# Patient Record
Sex: Female | Born: 1995 | Race: Black or African American | Hispanic: No | Marital: Single | Smoking: Current some day smoker
Health system: Southern US, Community
[De-identification: ages and names within clinical notes are randomized; demographics above are authoritative.]

---

## 2014-12-26 ENCOUNTER — Emergency Department (HOSPITAL_BASED_OUTPATIENT_CLINIC_OR_DEPARTMENT_OTHER): Payer: No Typology Code available for payment source

## 2014-12-26 ENCOUNTER — Emergency Department (HOSPITAL_BASED_OUTPATIENT_CLINIC_OR_DEPARTMENT_OTHER)
Admission: EM | Admit: 2014-12-26 | Discharge: 2014-12-26 | Disposition: A | Discharge status: Home / Self Care | Payer: No Typology Code available for payment source | Attending: Emergency Medicine | Admitting: Emergency Medicine

## 2014-12-26 ENCOUNTER — Encounter (HOSPITAL_BASED_OUTPATIENT_CLINIC_OR_DEPARTMENT_OTHER): Payer: Self-pay

## 2014-12-26 DIAGNOSIS — S3992XA Unspecified injury of lower back, initial encounter: Secondary | ICD-10-CM | POA: Diagnosis present

## 2014-12-26 DIAGNOSIS — Z3202 Encounter for pregnancy test, result negative: Secondary | ICD-10-CM | POA: Diagnosis not present

## 2014-12-26 DIAGNOSIS — S39012A Strain of muscle, fascia and tendon of lower back, initial encounter: Secondary | ICD-10-CM

## 2014-12-26 DIAGNOSIS — Y998 Other external cause status: Secondary | ICD-10-CM | POA: Diagnosis not present

## 2014-12-26 DIAGNOSIS — Y9389 Activity, other specified: Secondary | ICD-10-CM | POA: Diagnosis not present

## 2014-12-26 DIAGNOSIS — S79912A Unspecified injury of left hip, initial encounter: Secondary | ICD-10-CM | POA: Diagnosis not present

## 2014-12-26 DIAGNOSIS — Y92410 Unspecified street and highway as the place of occurrence of the external cause: Secondary | ICD-10-CM | POA: Insufficient documentation

## 2014-12-26 LAB — PREGNANCY, URINE: PREG TEST UR: NEGATIVE

## 2014-12-26 MED ORDER — IBUPROFEN 800 MG PO TABS
800.0000 mg | ORAL_TABLET | Freq: Once | ORAL | Status: AC
  Administered 2014-12-26: 800 mg via ORAL
  Filled 2014-12-26: qty 1

## 2014-12-26 MED ORDER — CYCLOBENZAPRINE HCL 10 MG PO TABS
10.0000 mg | ORAL_TABLET | Freq: Once | ORAL | Status: AC
  Administered 2014-12-26: 10 mg via ORAL
  Filled 2014-12-26: qty 1

## 2014-12-26 MED ORDER — NAPROXEN 500 MG PO TABS: 500.0000 mg | ORAL_TABLET | Freq: Two times a day (BID) | ORAL | Status: DC

## 2014-12-26 MED ORDER — METHOCARBAMOL 500 MG PO TABS: 500.0000 mg | ORAL_TABLET | Freq: Two times a day (BID) | ORAL | Status: AC
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
## 2014-12-26 NOTE — Discharge Instructions (Signed)

## 2015-02-02 ENCOUNTER — Emergency Department (HOSPITAL_BASED_OUTPATIENT_CLINIC_OR_DEPARTMENT_OTHER)
Admission: EM | Admit: 2015-02-02 | Discharge: 2015-02-02 | Disposition: A | Discharge status: Home / Self Care | Payer: Self-pay | Attending: Emergency Medicine | Admitting: Emergency Medicine

## 2015-02-02 ENCOUNTER — Emergency Department (HOSPITAL_BASED_OUTPATIENT_CLINIC_OR_DEPARTMENT_OTHER): Payer: Self-pay

## 2015-02-02 ENCOUNTER — Encounter (HOSPITAL_BASED_OUTPATIENT_CLINIC_OR_DEPARTMENT_OTHER): Payer: Self-pay | Admitting: Emergency Medicine

## 2015-02-02 DIAGNOSIS — S93401A Sprain of unspecified ligament of right ankle, initial encounter: Secondary | ICD-10-CM

## 2015-02-02 DIAGNOSIS — W108XXA Fall (on) (from) other stairs and steps, initial encounter: Secondary | ICD-10-CM | POA: Insufficient documentation

## 2015-02-02 DIAGNOSIS — Z79899 Other long term (current) drug therapy: Secondary | ICD-10-CM | POA: Insufficient documentation

## 2015-02-02 DIAGNOSIS — S93402A Sprain of unspecified ligament of left ankle, initial encounter: Secondary | ICD-10-CM | POA: Insufficient documentation

## 2015-02-02 DIAGNOSIS — Y9289 Other specified places as the place of occurrence of the external cause: Secondary | ICD-10-CM | POA: Insufficient documentation

## 2015-02-02 DIAGNOSIS — Y9389 Activity, other specified: Secondary | ICD-10-CM | POA: Insufficient documentation

## 2015-02-02 DIAGNOSIS — F17210 Nicotine dependence, cigarettes, uncomplicated: Secondary | ICD-10-CM | POA: Insufficient documentation

## 2015-02-02 DIAGNOSIS — Y990 Civilian activity done for income or pay: Secondary | ICD-10-CM | POA: Insufficient documentation

## 2015-02-02 MED ORDER — IBUPROFEN 800 MG PO TABS
800.0000 mg | ORAL_TABLET | Freq: Once | ORAL | Status: AC
  Administered 2015-02-02: 800 mg via ORAL
  Filled 2015-02-02: qty 1

## 2015-02-02 MED ORDER — NAPROXEN 500 MG PO TABS: 500.0000 mg | ORAL_TABLET | Freq: Two times a day (BID) | ORAL | Status: AC

## 2015-02-02 NOTE — ED Provider Notes (Signed)
CSN: [id number]     Arrival date & time 02/02/15  1314 History  By signing my name below, I, Nicole Hill, attest that this documentation has been prepared under the direction and in the presence of Jerelyn Scott, MD. Electronically Signed: Phillis Hill, ED Scribe. 02/02/2015. 3:13 PM.   Chief Complaint  Patient presents with  . Ankle Injury   Patient is a 20 y.o. female presenting with lower extremity injury. The history is provided by the patient. No language interpreter was used.  Ankle Injury This is a new problem. The current episode started yesterday. The problem occurs constantly. The problem has been gradually worsening. She has tried acetaminophen for the symptoms. The treatment provided no relief.  HPI Comments: Nicole Hill is a 20 y.o. female who presents to the Emergency Department complaining of a left ankle injury onset one day ago. Pt states that she slipped down stairs last night at work, falling forward onto her ankle. She states that she is unable to bear weight on the foot and presents in the ED sitting in a wheelchair. She reports taking 400 mg ibuprofen at 1030 AM to no relief. She denies hitting head, LOC, numbness or weakness to the area.   History reviewed. No pertinent past medical history. History reviewed. No pertinent past surgical history. No family history on file. Social History  Substance Use Topics  . Smoking status: Current Some Day Smoker    Types: Cigarettes  . Smokeless tobacco: None  . Alcohol Use: No   OB History    No data available     Review of Systems  Musculoskeletal: Positive for arthralgias.  Neurological: Negative for syncope, weakness and numbness.  All other systems reviewed and are negative.  Allergies  Review of patient's allergies indicates no known allergies.  Home Medications   Prior to Admission medications   Medication Sig Start Date End Date Taking? Authorizing Provider  methocarbamol (ROBAXIN) 500 MG tablet Take  1 tablet (500 mg total) by mouth 2 (two) times daily. 12/26/14   Rolland Porter, MD  naproxen (NAPROSYN) 500 MG tablet Take 1 tablet (500 mg total) by mouth 2 (two) times daily. 02/02/15   Jerelyn Scott, MD   BP 117/59 mmHg  Pulse 85  Temp(Src) 98.5 F (36.9 C) (Oral)  Resp 18  Ht  (1.6 m)  Wt 215 lb (97.523 kg)  BMI 38.09 kg/m2  SpO2 100%  LMP 01/13/2015  Vitals reviewed Physical Exam  Physical Examination: General appearance - alert, well appearing, and in no distress Mental status - alert, oriented to person, place, and time Chest - clear to auscultation, no wheezes, rales or rhonchi, symmetric air entry Neurological - alert, oriented, normal speech, strength and sensation intact in left foot /toes Musculoskeletal - ttp over lateral and medial malleolus, no ttp over proximal fibula, no joint tenderness, deformity or swelling Extremities - peripheral pulses normal, no pedal edema, no clubbing or cyanosis Skin - normal coloration and turgor, no rashes  ED Course  Procedures (including critical care time) DIAGNOSTIC STUDIES: Oxygen Saturation is 99% on RA, normal by my interpretation.    COORDINATION OF CARE: 3:11 PM-Discussed treatment plan which includes x-ray, ankle brace, crutches and orthopedics follow up with pt at bedside and pt agreed to plan.    Labs Review Labs Reviewed - No data to display  Imaging Review Dg Ankle Complete Left  02/02/2015  CLINICAL DATA:  Fall downstairs, left ankle pain and swelling. Decreased range of motion. EXAM: LEFT ANKLE COMPLETE -  3+ VIEW COMPARISON:  None. FINDINGS: There is no evidence of fracture, dislocation, or joint effusion. There is no evidence of arthropathy or other focal bone abnormality. Soft tissues are unremarkable. IMPRESSION: Negative. Electronically Signed   By: Judie Petit.  Shick M.D.   On: 02/02/2015 13:57   I have personally reviewed and evaluated these images and lab results as part of my medical decision-making.   EKG  Interpretation None      MDM   Final diagnoses:  Ankle sprain, right, initial encounter    Pt presenting with c/o right ankle pain after fall.  Xray is reassuring.  Foot/toes and NVI.  Pt placed in ASO, given crutches.  Advised RICE, antiinflammatories.  Discharged with strict return precautions.  Pt agreeable with plan.  I personally performed the services described in this documentation, which was scribed in my presence. The recorded information has been reviewed and is accurate.     Jerelyn Scott, MD 02/02/15 [phone]

## 2015-02-02 NOTE — Discharge Instructions (Signed)
Return to the ED with any concerns including increased pain, swelling/numbness/discoloration of foot or toes, or any other alarming symptoms °

## 2015-02-02 NOTE — ED Notes (Signed)
Pt tripped down stairs last night and injured left ankle.  Pt took ibuprofen  at 1030 this morning with no relief.

## 2016-10-14 IMAGING — DX DG ANKLE COMPLETE 3+V*L*
3 series · 3 of 3 positions shown · non-contrast
Comparison: None.

CLINICAL DATA: Fall downstairs, left ankle pain and swelling.
Decreased range of motion.

EXAM:
LEFT ANKLE COMPLETE - 3+ VIEW

[ankle ap]
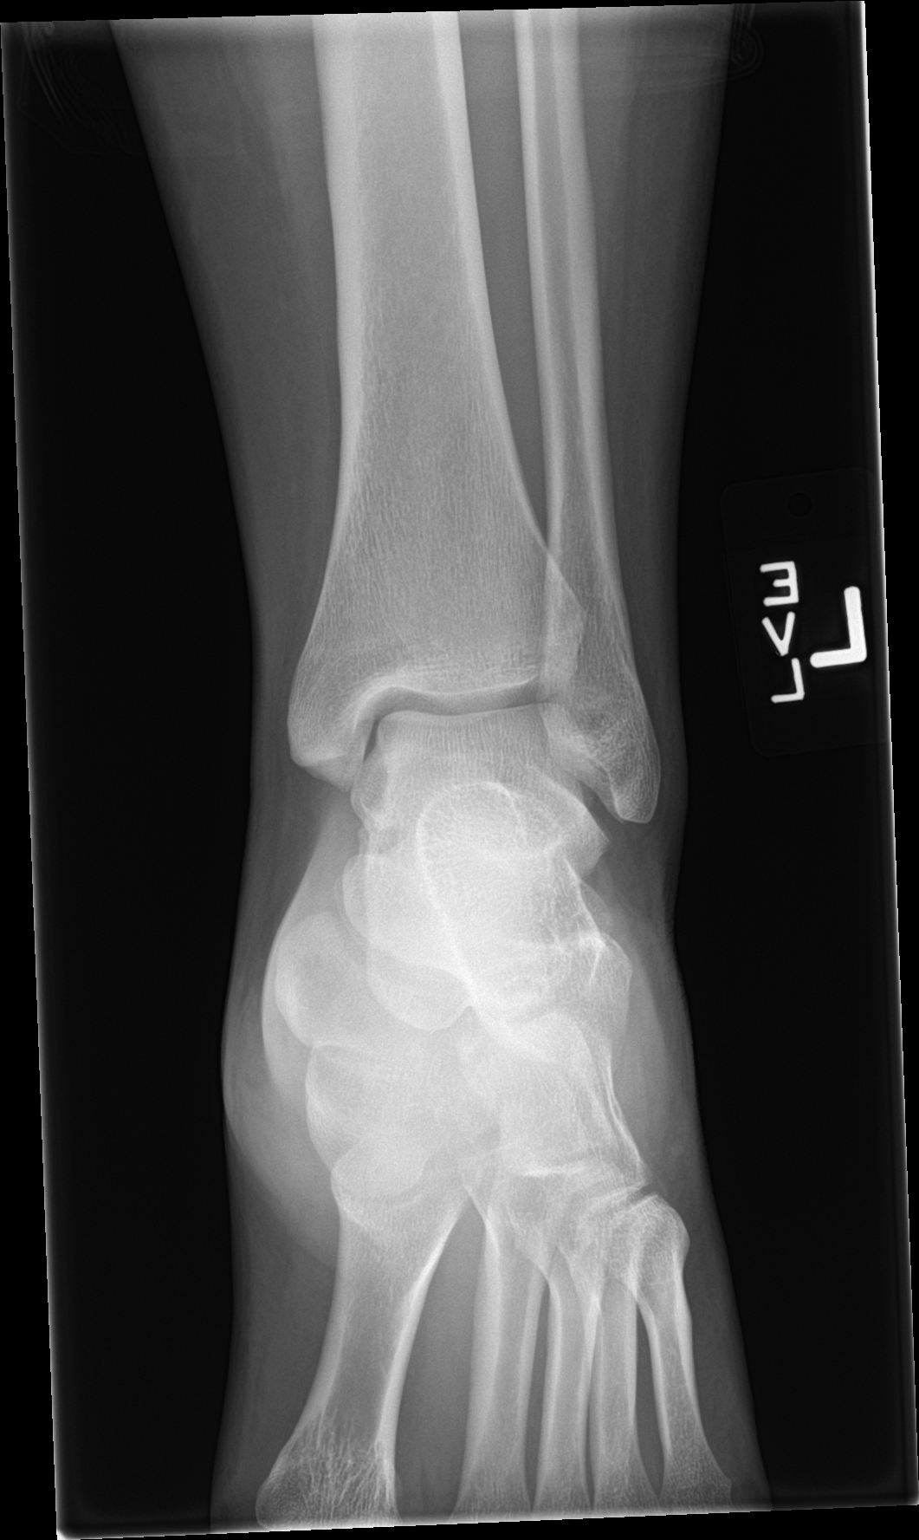

[ankle obl]
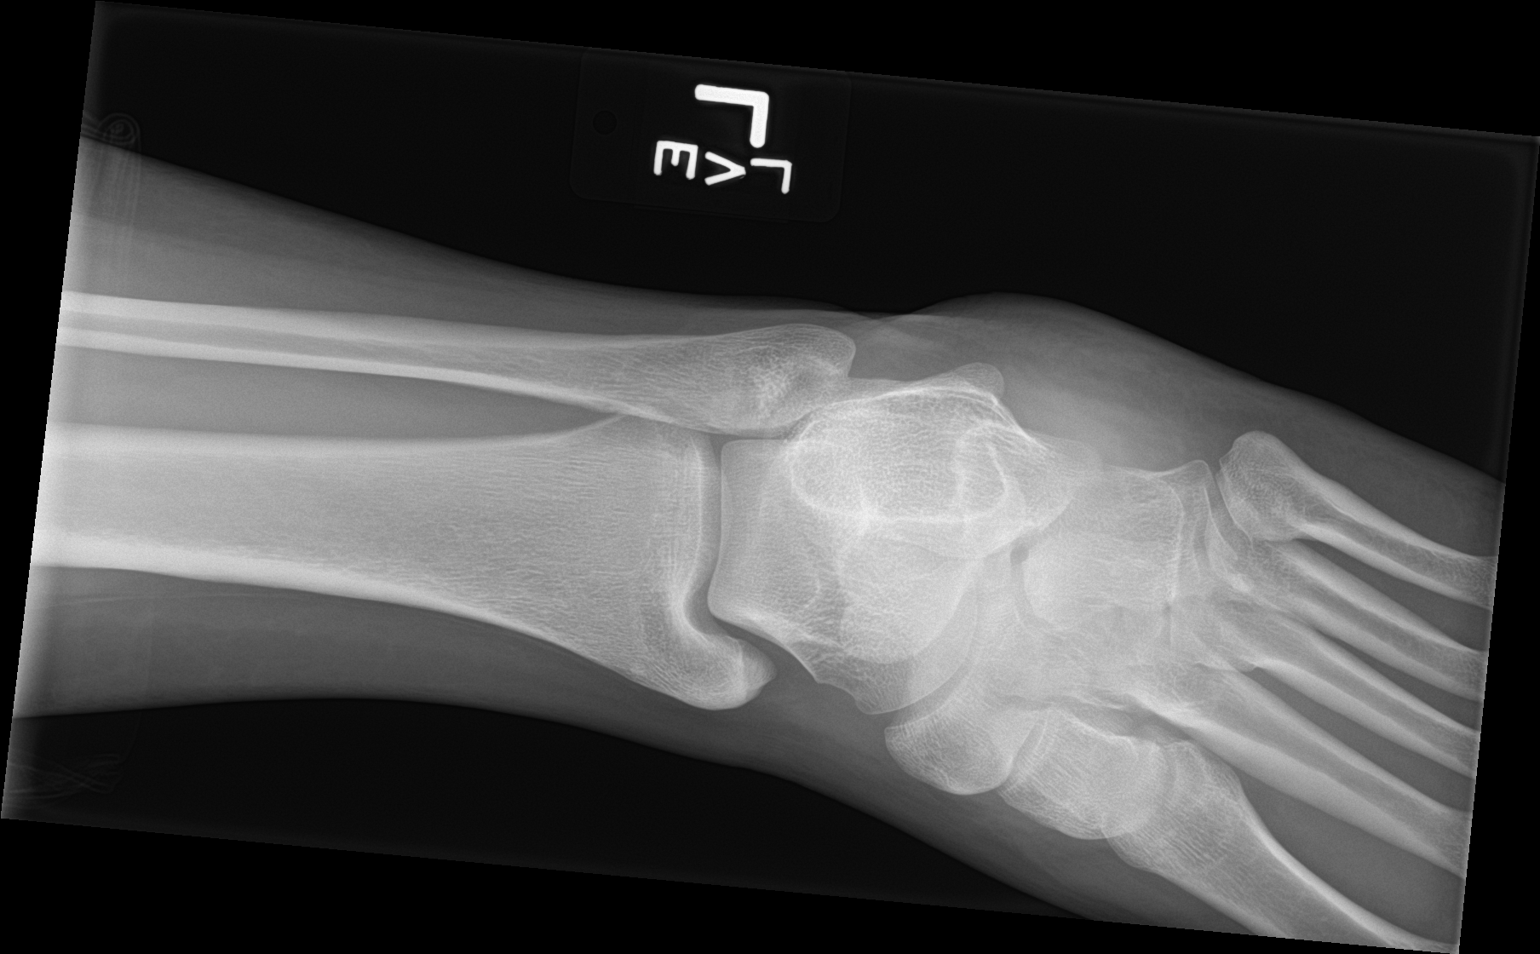

[ankle lat]
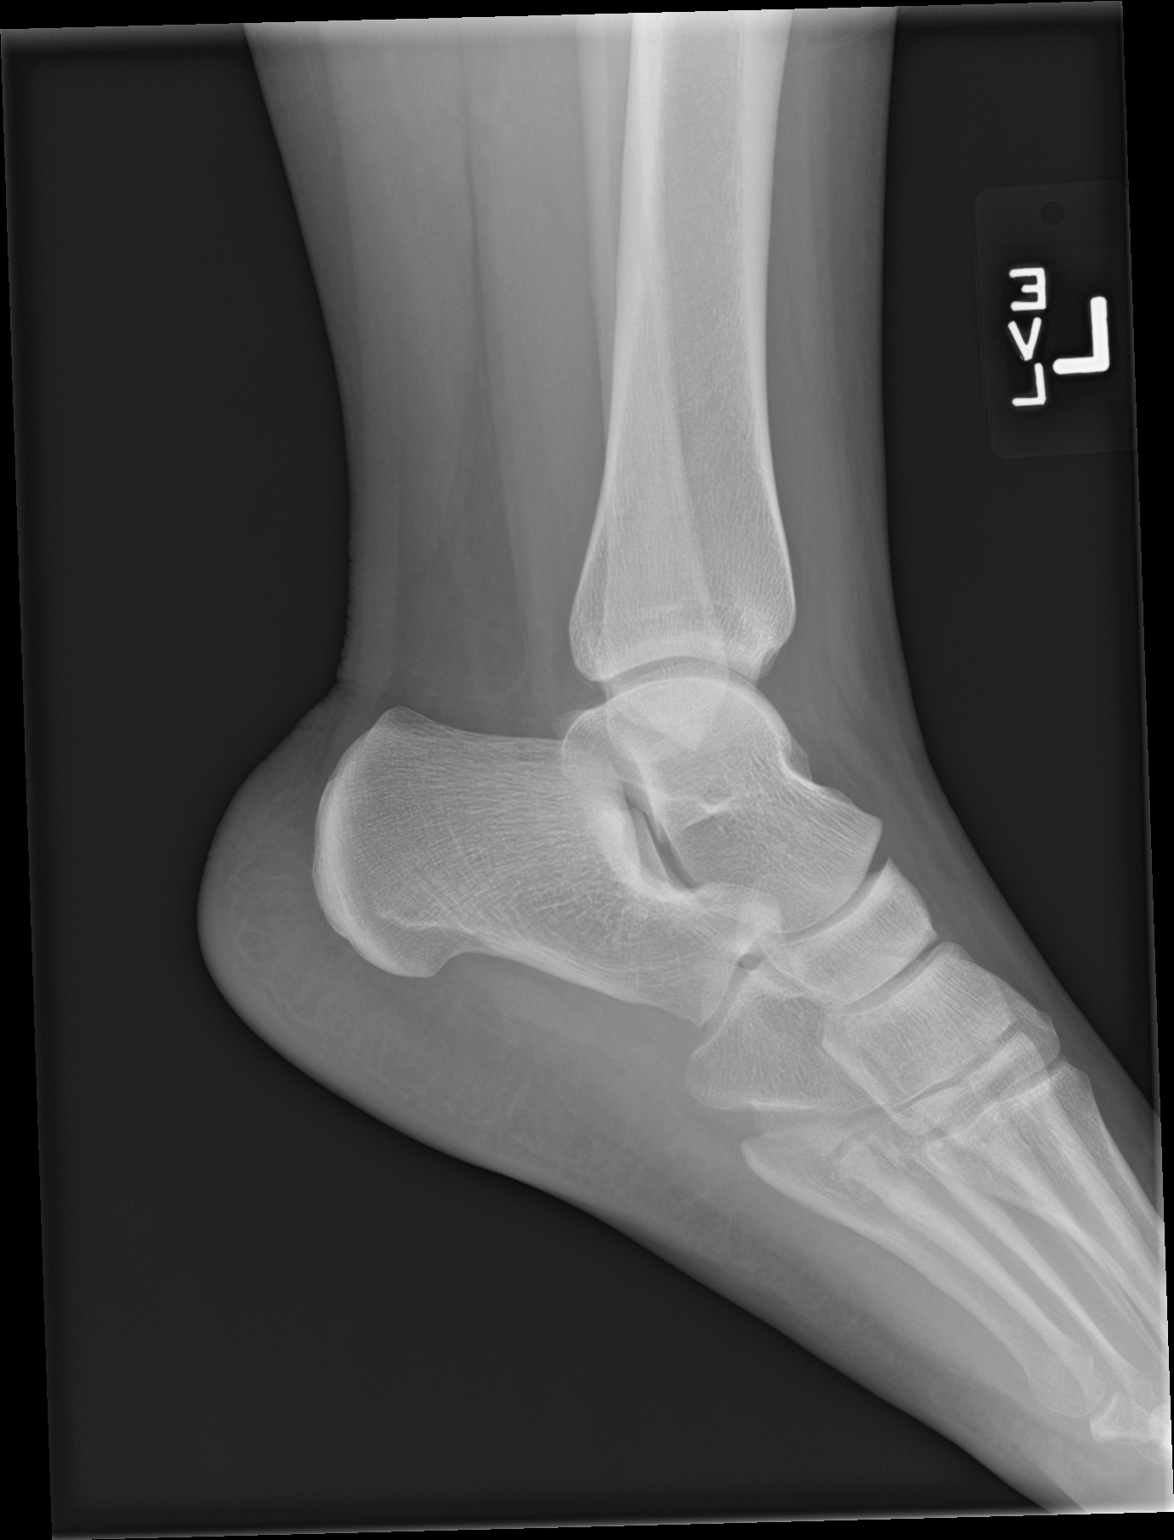

[3 of 3 positions shown; findings below may reference images not displayed]

FINDINGS: There is no evidence of fracture, dislocation, or joint effusion.
There is no evidence of arthropathy or other focal bone abnormality.
Soft tissues are unremarkable.
IMPRESSION: Negative.
